# Patient Record
Sex: Male | Born: 1975 | Race: Black or African American | Hispanic: No | Marital: Single | Smoking: Current every day smoker
Health system: Southern US, Community
[De-identification: ages and names within clinical notes are randomized; demographics above are authoritative.]

---

## 2003-08-22 ENCOUNTER — Emergency Department (HOSPITAL_COMMUNITY): Admission: EM | Admit: 2003-08-22 | Discharge: 2003-08-22 | Payer: Self-pay | Admitting: Family Medicine

## 2004-07-08 ENCOUNTER — Emergency Department (HOSPITAL_COMMUNITY): Admission: EM | Admit: 2004-07-08 | Discharge: 2004-07-08 | Payer: Self-pay | Admitting: Emergency Medicine

## 2005-04-19 IMAGING — CR DG HAND COMPLETE 3+V*R*
3 series · 3 of 3 positions shown · non-contrast
Comparison: none

CLINICAL DATA: Injured hand punching a wall. 
 RIGHT HAND, THREE VIEWS 
 Three views of the right hand were obtained.  There is a nondisplaced fracture through the base of the fifth metacarpal with adjacent soft tissue swelling.  No other acute bony abnormality is seen. 
 IMPRESSION
 Nondisplaced fracture through the base of the right fifth metacarpal.

[view not recorded (1 of 3)]
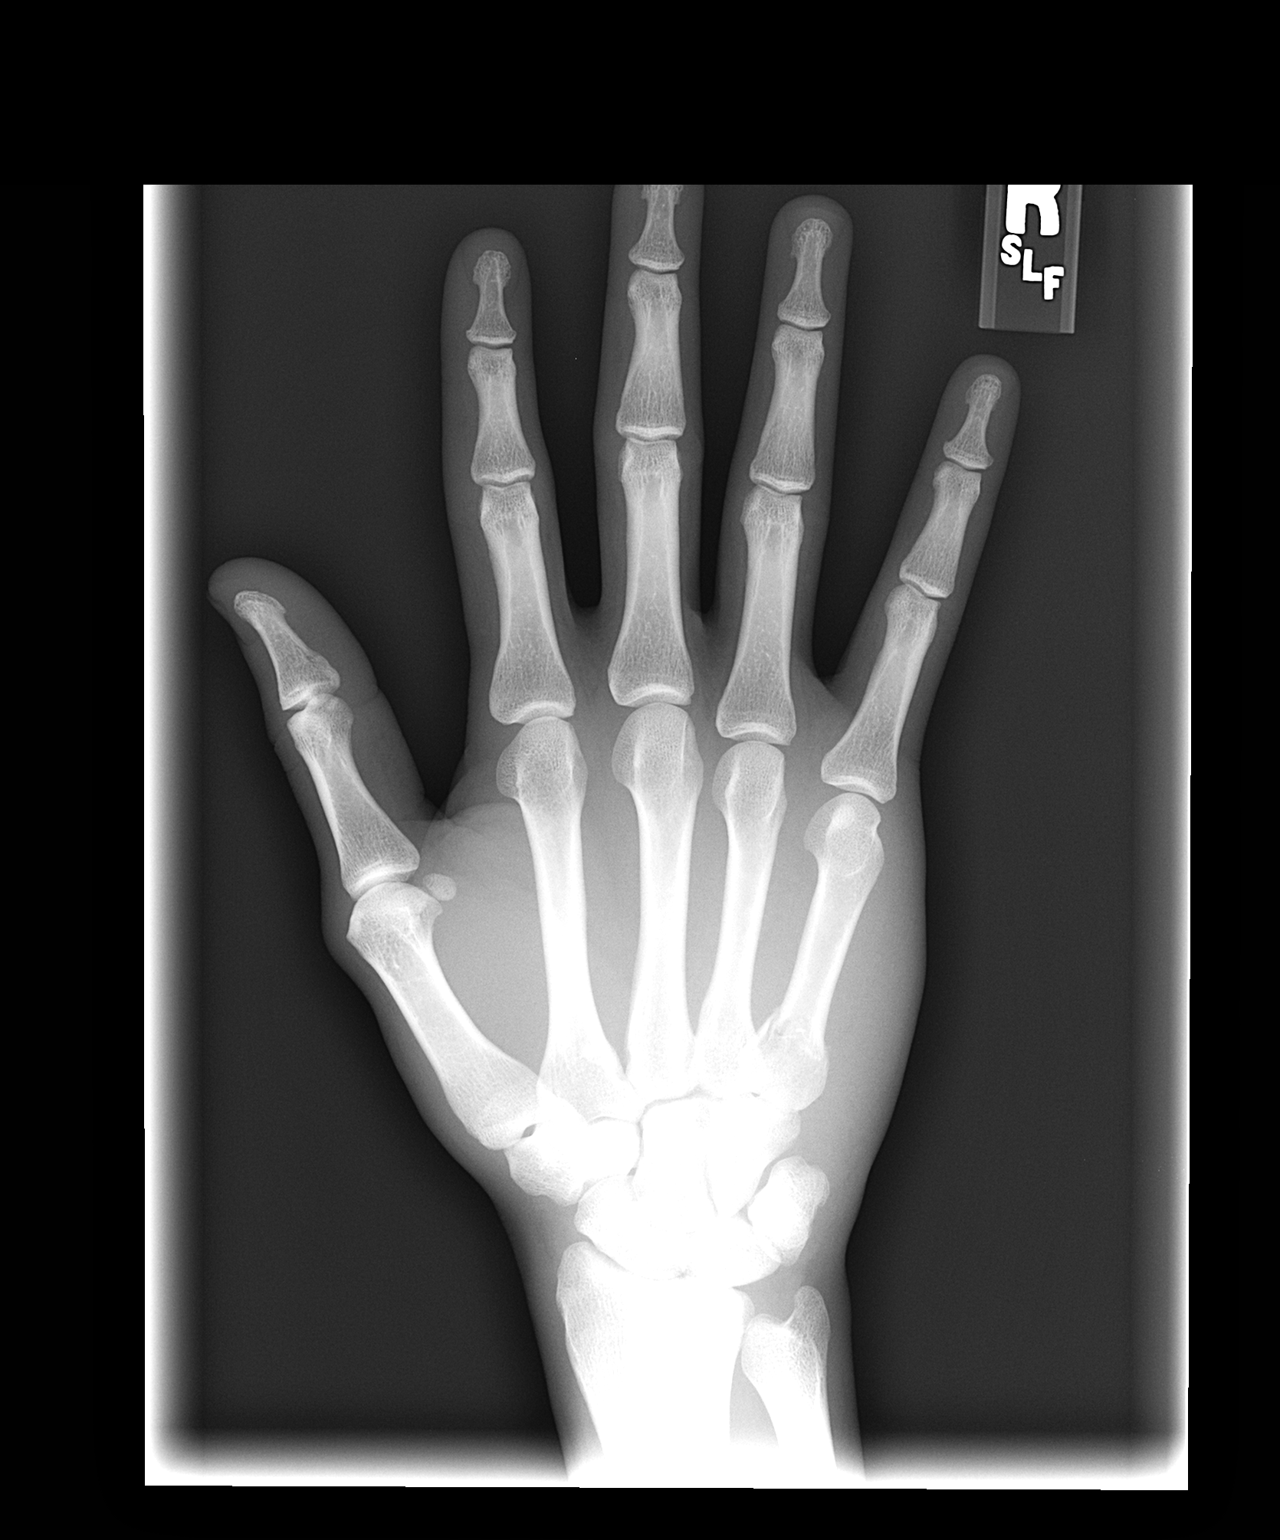

[view not recorded (2 of 3)]
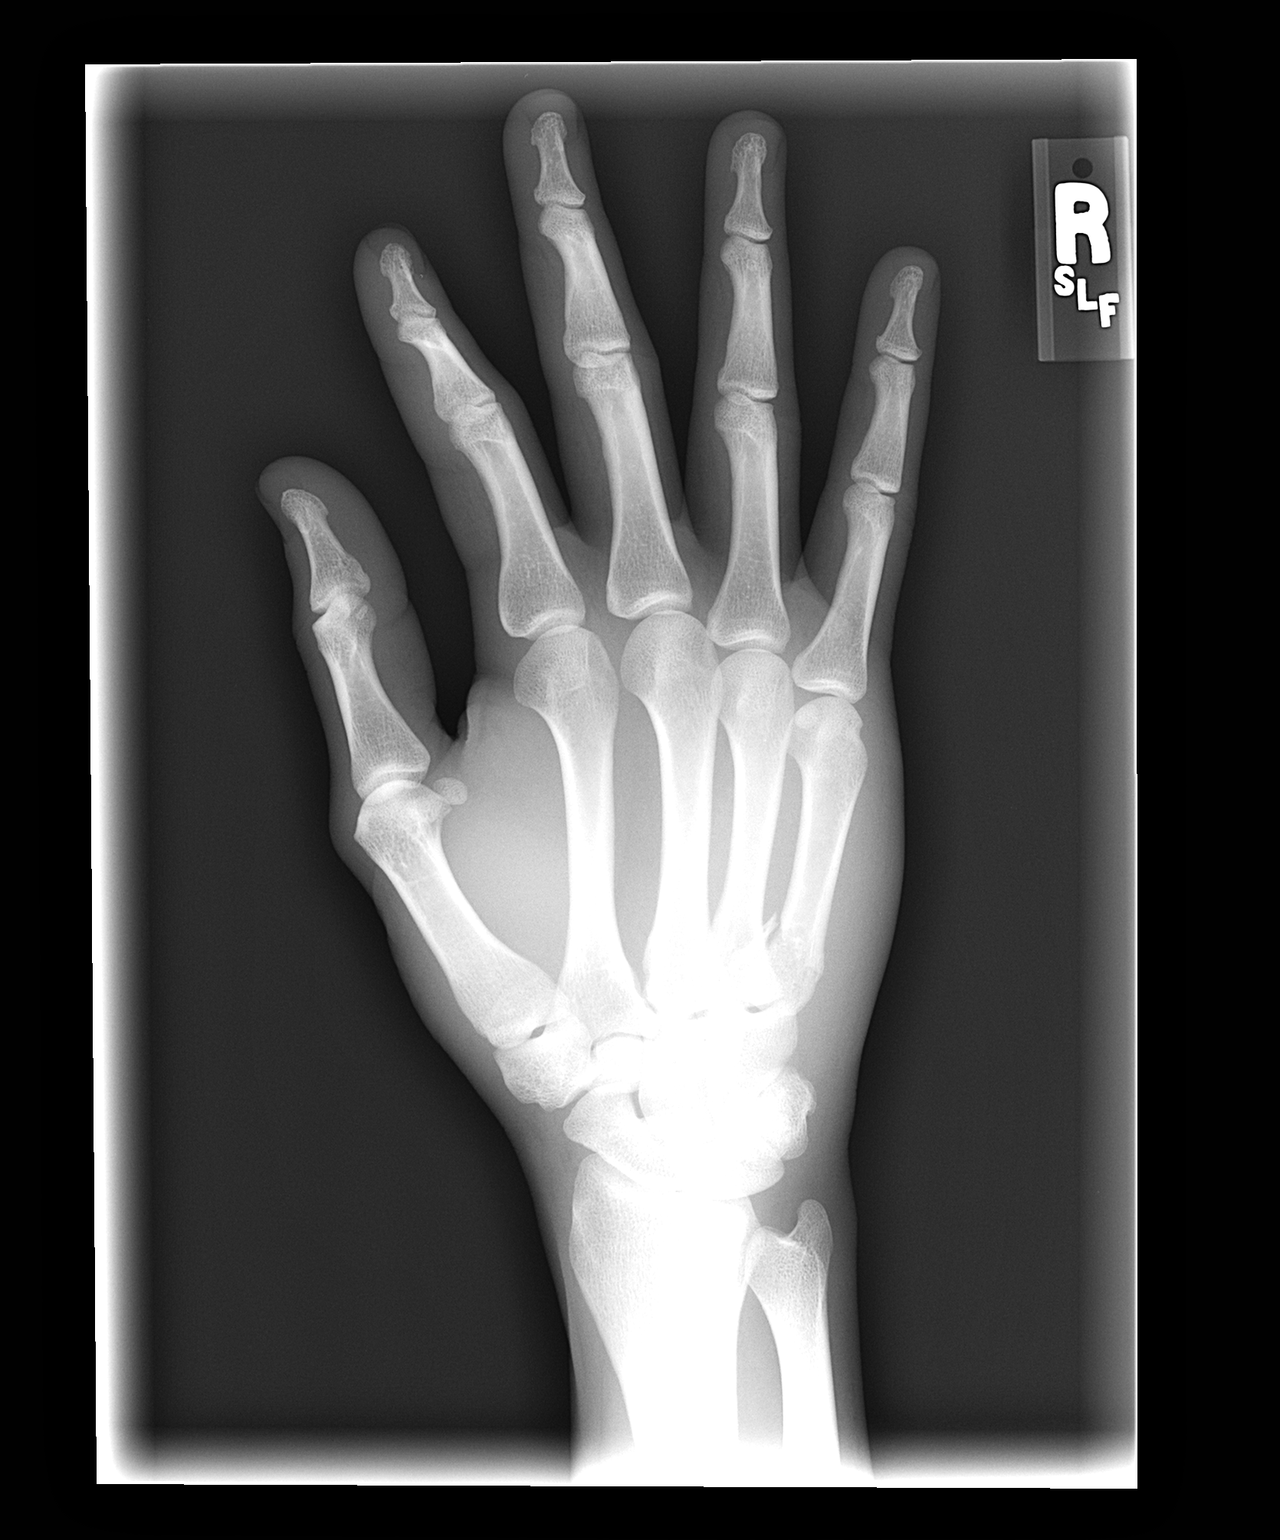

[view not recorded (3 of 3)]
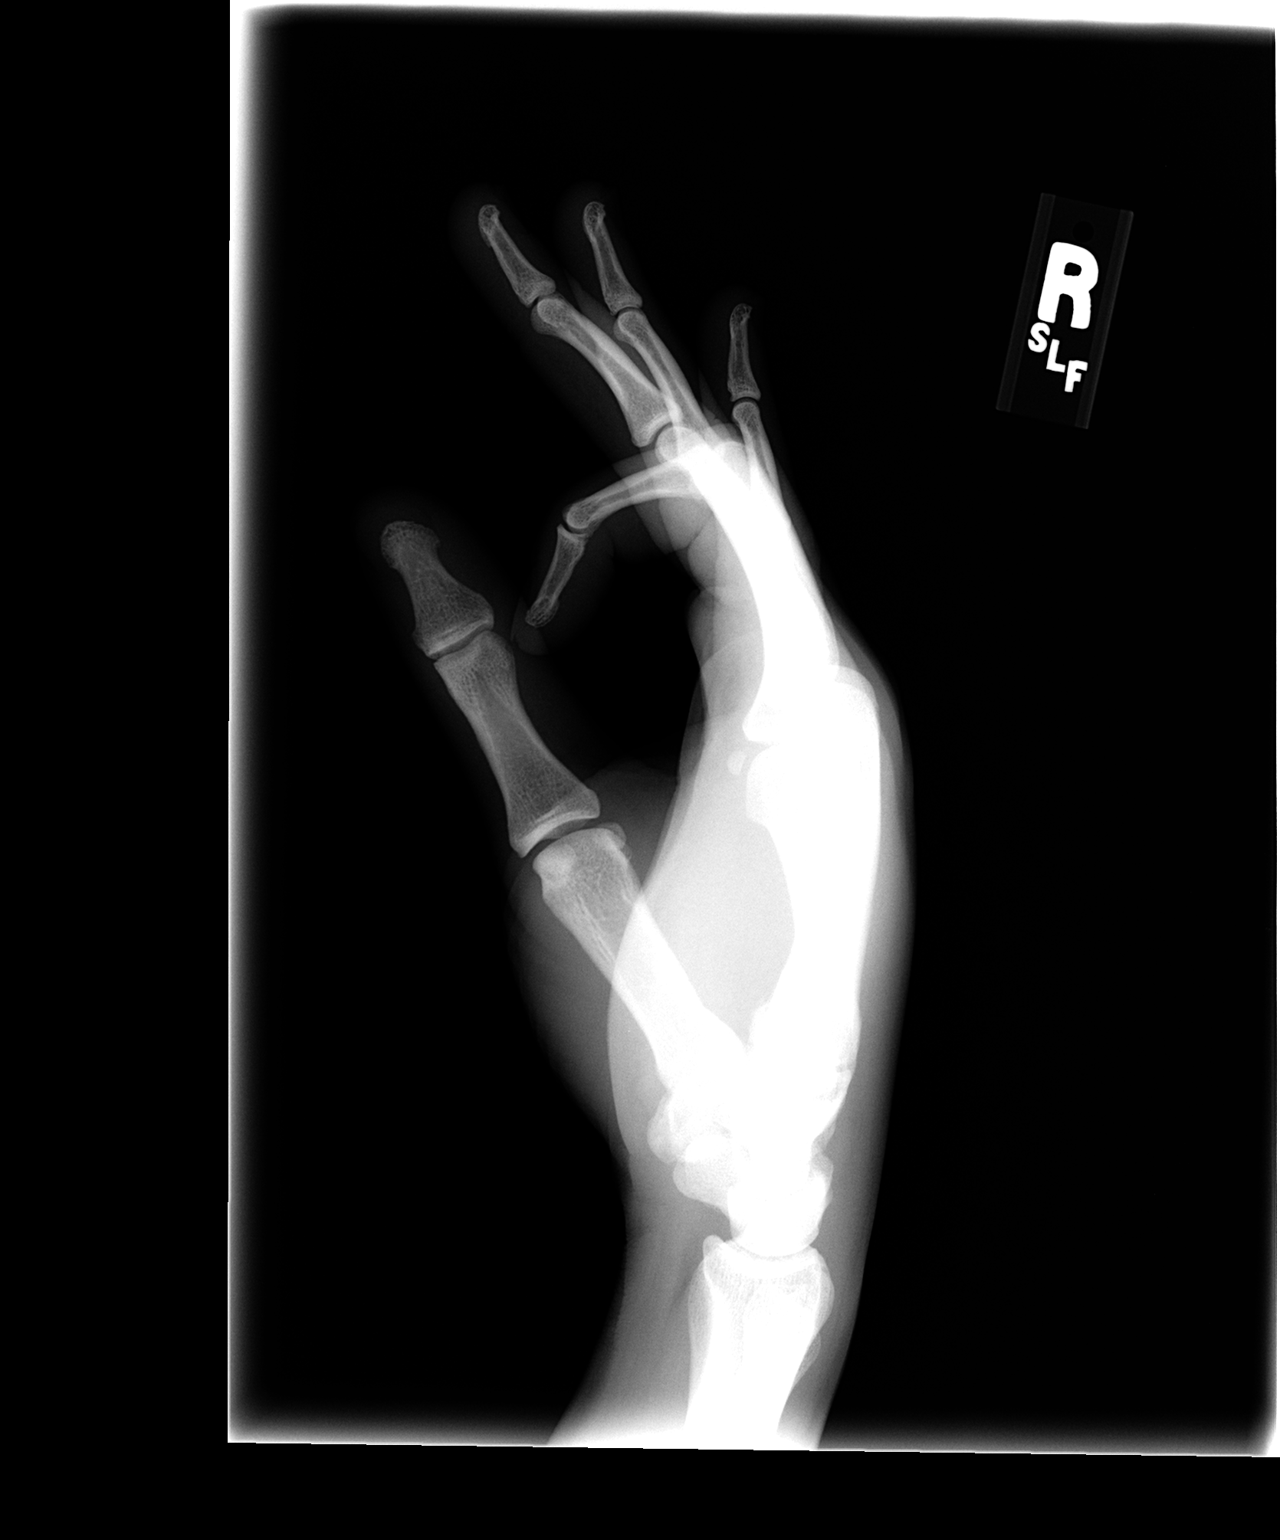

[3 of 3 positions shown; findings below may reference images not displayed]

## 2010-06-12 ENCOUNTER — Emergency Department (HOSPITAL_COMMUNITY)
Admission: EM | Admit: 2010-06-12 | Discharge: 2010-06-12 | Disposition: A | Discharge status: Home / Self Care | Payer: Self-pay | Attending: Emergency Medicine | Admitting: Emergency Medicine

## 2010-06-12 DIAGNOSIS — X58XXXA Exposure to other specified factors, initial encounter: Secondary | ICD-10-CM | POA: Insufficient documentation

## 2010-06-12 DIAGNOSIS — S61209A Unspecified open wound of unspecified finger without damage to nail, initial encounter: Secondary | ICD-10-CM | POA: Insufficient documentation

## 2011-01-13 ENCOUNTER — Emergency Department (HOSPITAL_COMMUNITY): Payer: BC Managed Care – PPO

## 2011-01-13 ENCOUNTER — Emergency Department (HOSPITAL_COMMUNITY)
Admission: EM | Admit: 2011-01-13 | Discharge: 2011-01-13 | Disposition: A | Discharge status: Home / Self Care | Payer: BC Managed Care – PPO | Attending: Emergency Medicine | Admitting: Emergency Medicine

## 2011-01-13 DIAGNOSIS — R091 Pleurisy: Secondary | ICD-10-CM | POA: Insufficient documentation

## 2011-01-13 DIAGNOSIS — R05 Cough: Secondary | ICD-10-CM | POA: Insufficient documentation

## 2011-01-13 DIAGNOSIS — R059 Cough, unspecified: Secondary | ICD-10-CM | POA: Insufficient documentation

## 2011-01-13 DIAGNOSIS — R079 Chest pain, unspecified: Secondary | ICD-10-CM | POA: Insufficient documentation

## 2011-01-13 LAB — BASIC METABOLIC PANEL
BUN: 14 mg/dL (ref 6–23)
Calcium: 9.9 mg/dL (ref 8.4–10.5)
Chloride: 104 mEq/L (ref 96–112)
Creatinine, Ser: 1.05 mg/dL (ref 0.50–1.35)
GFR calc non Af Amer: >60 mL/min (ref >60)
Glucose, Bld: 86 mg/dL (ref 70–99)
Sodium: 140 mEq/L (ref 135–145)

## 2011-01-13 LAB — CBC
HCT: 44.1 % (ref 39.0–52.0)
Hemoglobin: 14.8 g/dL (ref 13.0–17.0)
MCV: 94.6 fL (ref 78.0–100.0)
RBC: 4.66 MIL/uL (ref 4.22–5.81)

## 2011-01-13 LAB — DIFFERENTIAL
Basophils Relative: 1 % (ref 0–1)
Eosinophils Absolute: 0.2 10*3/uL (ref 0.0–0.7)
Eosinophils Relative: 3 % (ref 0–5)
Monocytes Absolute: 0.6 10*3/uL (ref 0.1–1.0)
Monocytes Relative: 10 % (ref 3–12)

## 2011-01-13 MED ORDER — IOHEXOL 300 MG/ML  SOLN
100.0000 mL | Freq: Once | INTRAMUSCULAR | Status: AC | PRN
  Administered 2011-01-13: 100 mL via INTRAVENOUS

## 2012-06-02 ENCOUNTER — Emergency Department (HOSPITAL_COMMUNITY)
Admission: EM | Admit: 2012-06-02 | Discharge: 2012-06-02 | Disposition: A | Discharge status: Home / Self Care | Payer: Self-pay | Attending: Emergency Medicine | Admitting: Emergency Medicine

## 2012-06-02 ENCOUNTER — Encounter (HOSPITAL_COMMUNITY): Payer: Self-pay | Admitting: *Deleted

## 2012-06-02 DIAGNOSIS — R599 Enlarged lymph nodes, unspecified: Secondary | ICD-10-CM | POA: Insufficient documentation

## 2012-06-02 DIAGNOSIS — R05 Cough: Secondary | ICD-10-CM | POA: Insufficient documentation

## 2012-06-02 DIAGNOSIS — K029 Dental caries, unspecified: Secondary | ICD-10-CM | POA: Insufficient documentation

## 2012-06-02 DIAGNOSIS — K0401 Reversible pulpitis: Secondary | ICD-10-CM | POA: Diagnosis present

## 2012-06-02 DIAGNOSIS — J069 Acute upper respiratory infection, unspecified: Secondary | ICD-10-CM | POA: Insufficient documentation

## 2012-06-02 DIAGNOSIS — R059 Cough, unspecified: Secondary | ICD-10-CM | POA: Insufficient documentation

## 2012-06-02 DIAGNOSIS — F17200 Nicotine dependence, unspecified, uncomplicated: Secondary | ICD-10-CM | POA: Insufficient documentation

## 2012-06-02 DIAGNOSIS — K089 Disorder of teeth and supporting structures, unspecified: Secondary | ICD-10-CM | POA: Insufficient documentation

## 2012-06-02 MED ORDER — IBUPROFEN 600 MG PO TABS: 600.0000 mg | ORAL_TABLET | Freq: Four times a day (QID) | ORAL | Status: DC | PRN

## 2012-06-02 MED ORDER — PENICILLIN V POTASSIUM 500 MG PO TABS: 500.0000 mg | ORAL_TABLET | Freq: Four times a day (QID) | ORAL | Status: AC

## 2012-06-02 NOTE — ED Notes (Addendum)
Pt reports drinking 12 drinks ETOH daily. Pt reports he was hungover a few days ago and vomited forcefully and hurt his throat. Since then he has has right sided pain in his gum and cheek area. Denies any changes with vision or blurry vision. Reports feels like a brain freeze when he drinks something cold. Pain 8/10  Pt reports no hx of tooth pain in past

## 2012-06-02 NOTE — ED Notes (Signed)
Pt reports taking 2 extra strength tylenol prior to arrival.

## 2012-06-02 NOTE — ED Provider Notes (Signed)
History     CSN: [id number]  Arrival date & time 06/02/12  1136   First MD Initiated Contact with Patient 06/02/12 1247      Chief Complaint  Patient presents with  . Facial Pain    (Consider location/radiation/quality/duration/timing/severity/associated sxs/prior treatment) Patient is a 37 y.o. male presenting with pharyngitis. The history is provided by the patient.  Sore Throat This is a new problem. Episode onset: 3 days ago. The problem occurs constantly. The problem has been unchanged. Associated symptoms include coughing (mild), a sore throat and vomiting (several times 3 days ago, none since). Pertinent negatives include no abdominal pain, chest pain, fever, headaches, nausea, neck pain or numbness. Exacerbated by: vomiting. Treatments tried: motrin. The treatment provided mild relief.    History reviewed. No pertinent past medical history.  History reviewed. No pertinent past surgical history.  History reviewed. No pertinent family history.  History  Substance Use Topics  . Smoking status: Current Every Day Smoker -- 1.50 packs/day for 10 years    Types: Cigarettes  . Smokeless tobacco: Not on file  . Alcohol Use: Yes     Comment: 12 drinks/ daily      Review of Systems  Constitutional: Negative for fever.  HENT: Positive for sore throat. Negative for rhinorrhea, drooling and neck pain.   Eyes: Negative for pain.  Respiratory: Positive for cough (mild). Negative for shortness of breath.   Cardiovascular: Negative for chest pain and leg swelling.  Gastrointestinal: Positive for vomiting (several times 3 days ago, none since). Negative for nausea, abdominal pain and diarrhea.  Genitourinary: Negative for dysuria and hematuria.  Musculoskeletal: Negative for gait problem.  Skin: Negative for color change.  Neurological: Negative for numbness and headaches.  Hematological: Negative for adenopathy.  Psychiatric/Behavioral: Negative for behavioral problems.  All  other systems reviewed and are negative.    Allergies  Review of patient's allergies indicates no known allergies.  Home Medications   Current Outpatient Rx  Name  Route  Sig  Dispense  Refill  . ibuprofen (ADVIL,MOTRIN) 800 MG tablet   Oral   Take 800 mg by mouth every 8 (eight) hours as needed for pain.         Marland Kitchen ibuprofen (ADVIL,MOTRIN) 600 MG tablet   Oral   Take 1 tablet (600 mg total) by mouth every 6 (six) hours as needed for pain.   30 tablet   0   . penicillin v potassium (VEETID) 500 MG tablet   Oral   Take 1 tablet (500 mg total) by mouth 4 (four) times daily.   40 tablet   0     BP 155/96  Pulse 62  Temp(Src) 98.7 F (37.1 C) (Oral)  Resp 18  SpO2 98%  Physical Exam  Nursing note and vitals reviewed. Constitutional: He is oriented to person, place, and time. He appears well-developed and well-nourished.  HENT:  Head: Normocephalic and atraumatic.  Right Ear: External ear normal.  Left Ear: External ear normal.  Nose: Nose normal.  Mouth/Throat: Oropharynx is clear and moist. No oropharyngeal exudate.  Normal appearing oropharynx w/out exudate or asymmetry. Mild bilateral anterior cervical adenopathy.   Significant erosion to bilateral upper 3rd molars. No evidence or peri-apical abscess.   Eyes: Conjunctivae and EOM are normal. Pupils are equal, round, and reactive to light.  Neck: Normal range of motion. Neck supple.  Cardiovascular: Normal rate, regular rhythm, normal heart sounds and intact distal pulses.  Exam reveals no gallop and no friction rub.  No murmur heard. Pulmonary/Chest: Effort normal and breath sounds normal. No respiratory distress. He has no wheezes.  Abdominal: Soft. Bowel sounds are normal. He exhibits no distension. There is no tenderness. There is no rebound and no guarding.  Musculoskeletal: Normal range of motion. He exhibits no edema and no tenderness.  Neurological: He is alert and oriented to person, place, and time.   Skin: Skin is warm and dry.  Psychiatric: He has a normal mood and affect. His behavior is normal.    ED Course  Procedures (including critical care time)  Labs Reviewed - No data to display No results found.   1. Viral URI   2. Pulpitis       MDM  10:11 PM 37 y.o. male pw throat pain that began 3 days ago after several episodes of vomiting. Pt states he was drinking the night before, had nbnb emesis the next day and has had a sore throat since. He also notes ttp proximal to his right upper 3rd molar. Pt denies N/V/D/F, denies difficulty swallowing or breathing. Pt AFVSS here, appears well on exam w/out any evidence of acute dental infection. Suspect pt has pulpitis from dental caries and poor dentition, irritation of his oral mucosa from persistent smoking, and likely viral URI.   10:11 PM: Pt continues to appear well. I have discussed the diagnosis/risks/treatment options with the patient and believe the pt to be eligible for discharge home to follow-up with a dentist next week. We also discussed returning to the ED immediately if new or worsening sx occur. We discussed the sx which are most concerning (e.g., worsening pain, difficulty swallowing or breathing) that necessitate immediate return. Any new prescriptions provided to the patient are listed below.  Discharge Medication List as of 06/02/2012 12:57 PM    START taking these medications   Details  !! ibuprofen (ADVIL,MOTRIN) 600 MG tablet Take 1 tablet (600 mg total) by mouth every 6 (six) hours as needed for pain., Starting 06/02/2012, Until Discontinued, Print    penicillin v potassium (VEETID) 500 MG tablet Take 1 tablet (500 mg total) by mouth 4 (four) times daily., Starting 06/02/2012, Last dose on Sat 06/09/12, Print     !! - Potential duplicate medications found. Please discuss with provider.       Clinical Impression 1. Viral URI   2. Pulpitis          Purvis Sheffield, MD 06/02/12 2211

## 2012-06-02 NOTE — ED Notes (Signed)
Harrison, MD at bedside. 

## 2012-06-04 NOTE — ED Provider Notes (Signed)
I saw and evaluated the patient, reviewed the resident's note and I agree with the findings and plan. Pt with uri symptoms. No facial swelling or redness, no trouble breathing or swallowing.   Suzi Roots, MD 06/04/12 [phone]

## 2020-02-13 ENCOUNTER — Other Ambulatory Visit: Payer: Self-pay

## 2020-02-13 DIAGNOSIS — Z20822 Contact with and (suspected) exposure to covid-19: Secondary | ICD-10-CM

## 2020-02-14 LAB — NOVEL CORONAVIRUS, NAA: SARS-CoV-2, NAA: DETECTED — AB

## 2020-02-14 LAB — SARS-COV-2, NAA 2 DAY TAT

## 2020-02-24 ENCOUNTER — Ambulatory Visit: Payer: Self-pay

## 2020-02-25 ENCOUNTER — Other Ambulatory Visit: Payer: Self-pay

## 2020-02-25 ENCOUNTER — Other Ambulatory Visit: Payer: Managed Care, Other (non HMO)

## 2020-02-25 DIAGNOSIS — Z20822 Contact with and (suspected) exposure to covid-19: Secondary | ICD-10-CM

## 2020-02-26 LAB — NOVEL CORONAVIRUS, NAA: SARS-CoV-2, NAA: NOT DETECTED

## 2020-02-26 LAB — SARS-COV-2, NAA 2 DAY TAT

## 2020-08-18 ENCOUNTER — Emergency Department (HOSPITAL_COMMUNITY): Payer: Self-pay

## 2020-08-18 ENCOUNTER — Emergency Department (HOSPITAL_COMMUNITY)
Admission: EM | Admit: 2020-08-18 | Discharge: 2020-08-18 | Disposition: A | Discharge status: Home / Self Care | Payer: Self-pay | Attending: Emergency Medicine | Admitting: Emergency Medicine

## 2020-08-18 ENCOUNTER — Encounter (HOSPITAL_COMMUNITY): Payer: Self-pay | Admitting: *Deleted

## 2020-08-18 DIAGNOSIS — R739 Hyperglycemia, unspecified: Secondary | ICD-10-CM | POA: Insufficient documentation

## 2020-08-18 DIAGNOSIS — R072 Precordial pain: Secondary | ICD-10-CM | POA: Insufficient documentation

## 2020-08-18 DIAGNOSIS — R0789 Other chest pain: Secondary | ICD-10-CM

## 2020-08-18 DIAGNOSIS — R079 Chest pain, unspecified: Secondary | ICD-10-CM

## 2020-08-18 DIAGNOSIS — F17210 Nicotine dependence, cigarettes, uncomplicated: Secondary | ICD-10-CM | POA: Insufficient documentation

## 2020-08-18 LAB — COMPREHENSIVE METABOLIC PANEL
ALT: 40 U/L (ref 0–44)
AST: 32 U/L (ref 15–41)
Albumin: 4 g/dL (ref 3.5–5.0)
Alkaline Phosphatase: 48 U/L (ref 38–126)
Anion gap: 8 (ref 5–15)
BUN: 7 mg/dL (ref 6–20)
CO2: 29 mmol/L (ref 22–32)
Calcium: 9.5 mg/dL (ref 8.9–10.3)
Chloride: 102 mmol/L (ref 98–111)
Creatinine, Ser: 1.17 mg/dL (ref 0.61–1.24)
GFR, Estimated: >60 mL/min (ref >60)
Glucose, Bld: 111 mg/dL — ABNORMAL HIGH (ref 70–99)
Potassium: 4.1 mmol/L (ref 3.5–5.1)
Sodium: 139 mmol/L (ref 135–145)
Total Bilirubin: 1.1 mg/dL (ref 0.3–1.2)
Total Protein: 7.6 g/dL (ref 6.5–8.1)

## 2020-08-18 LAB — CBC
HCT: 44.2 % (ref 39.0–52.0)
Hemoglobin: 14.4 g/dL (ref 13.0–17.0)
MCH: 31.1 pg (ref 26.0–34.0)
MCHC: 32.6 g/dL (ref 30.0–36.0)
MCV: 95.5 fL (ref 80.0–100.0)
Platelets: 176 10*3/uL (ref 150–400)
RBC: 4.63 MIL/uL (ref 4.22–5.81)
RDW: 12.1 % (ref 11.5–15.5)
WBC: 6.3 10*3/uL (ref 4.0–10.5)
nRBC: 0 % (ref 0.0–0.2)

## 2020-08-18 LAB — LIPASE, BLOOD: Lipase: 27 U/L (ref 11–51)

## 2020-08-18 LAB — TROPONIN I (HIGH SENSITIVITY): Troponin I (High Sensitivity): 3 ng/L (ref <18)

## 2020-08-18 MED ORDER — ALUM & MAG HYDROXIDE-SIMETH 200-200-20 MG/5ML PO SUSP
30.0000 mL | Freq: Once | ORAL | Status: AC
  Administered 2020-08-18: 30 mL via ORAL
  Filled 2020-08-18: qty 30

## 2020-08-18 MED ORDER — OMEPRAZOLE 20 MG PO CPDR: 20.0000 mg | DELAYED_RELEASE_CAPSULE | Freq: Every day | ORAL | 0 refills | Status: DC

## 2020-08-18 MED ORDER — ONDANSETRON 4 MG PO TBDP
4.0000 mg | ORAL_TABLET | Freq: Once | ORAL | Status: AC
  Administered 2020-08-18: 4 mg via ORAL
  Filled 2020-08-18: qty 1

## 2020-08-18 MED ORDER — LIDOCAINE VISCOUS HCL 2 % MT SOLN
15.0000 mL | Freq: Once | OROMUCOSAL | Status: AC
  Administered 2020-08-18: 15 mL via ORAL
  Filled 2020-08-18: qty 15

## 2020-08-18 MED ORDER — SUCRALFATE 1 G PO TABS
1.0000 g | ORAL_TABLET | Freq: Once | ORAL | Status: AC
  Administered 2020-08-18: 1 g via ORAL
  Filled 2020-08-18: qty 1

## 2020-08-18 NOTE — ED Provider Notes (Signed)
[facility]-EMERGENCY DEPT Provider Note   CSN: [id number] Arrival date & time: 08/18/20  1416     History Chief Complaint  Patient presents with  . Chest Pain    Trevor Shannon is a 45 y.o. male.  HPI   Patient with no significant medical history presents to the emergency department with chief complaint of substernal chest pain.  Patient states pain started on Saturday morning, describes the pain as a pressure-like sensation which makes him feel like he needs to vomit and burp, he states the night prior he was out drinking a lot of alcohol and he woke up Saturday morning with this pain.  The pain does not radiate, is not associated with shortness of breath, becoming diaphoretic, lightheadedness, dizziness, paresthesia or weakness in the upper or lower extremities.  Patient has no cardiac history, no history of PEs or DVTs, currently not on hormone therapy.  Patient does endorse that he smokes 1 pack of cigarettes a week, he is a nondiabetic, does not have hypertension, no history of Marfan's disease or connective tissue disorders.  Patient states eating and drinking makes the pain worse, denies alleviating factors.  Patient denies headaches, fevers, chills, shortness of breath, abdominal pain, urinary symptoms, worsening pedal edema.  History reviewed. No pertinent past medical history.  Patient Active Problem List   Diagnosis Date Noted  . Pulpitis 06/02/2012  . Viral URI 06/02/2012    History reviewed. No pertinent surgical history.     No family history on file.  Social History   Tobacco Use  . Smoking status: Current Every Day Smoker    Packs/day: 1.50    Years: 10.00    Pack years: 15.00    Types: Cigarettes  Substance Use Topics  . Alcohol use: Yes    Comment: 12 drinks/ daily  . Drug use: Yes    Types: Marijuana    Home Medications Prior to Admission medications   Medication Sig Start Date End Date Taking? Authorizing Provider   omeprazole (PRILOSEC) 20 MG capsule Take 1 capsule (20 mg total) by mouth daily. 08/18/20 09/17/20 Yes Carroll Sage, PA-C  ibuprofen (ADVIL,MOTRIN) 600 MG tablet Take 1 tablet (600 mg total) by mouth every 6 (six) hours as needed for pain. 06/02/12   Purvis Sheffield, MD  ibuprofen (ADVIL,MOTRIN) 800 MG tablet Take 800 mg by mouth every 8 (eight) hours as needed for pain.    [provider]    Allergies    Patient has no known allergies.  Review of Systems   Review of Systems  Constitutional: Negative for chills and fever.  HENT: Negative for congestion and sore throat.   Respiratory: Negative for shortness of breath.   Cardiovascular: Positive for chest pain.  Gastrointestinal: Positive for nausea. Negative for abdominal pain, blood in stool and vomiting.  Genitourinary: Negative for dysuria and enuresis.  Musculoskeletal: Negative for back pain.  Skin: Negative for rash.  Neurological: Negative for headaches.  Hematological: Does not bruise/bleed easily.    Physical Exam Updated Vital Signs BP (!) 174/102   Pulse 69   Temp 98.8 F (37.1 C) (Oral)   Resp 16   Ht 6\' 1"  (1.854 m)   Wt 70.8 kg   SpO2 100%   BMI 20.58 kg/m   Physical Exam Vitals and nursing note reviewed.  Constitutional:      General: He is not in acute distress.    Appearance: He is not ill-appearing.  HENT:     Head: Normocephalic and  atraumatic.     Nose: No congestion.  Eyes:     Conjunctiva/sclera: Conjunctivae normal.  Cardiovascular:     Rate and Rhythm: Normal rate and regular rhythm.     Pulses: Normal pulses.     Heart sounds: No murmur heard. No friction rub. No gallop.      Comments: Patient is currently chest pain-free, chest is palpated nontender to palpation. Pulmonary:     Effort: No respiratory distress.     Breath sounds: No wheezing, rhonchi or rales.  Abdominal:     Palpations: Abdomen is soft.     Tenderness: There is no abdominal tenderness.  Musculoskeletal:      Right lower leg: No edema.     Left lower leg: No edema.  Skin:    General: Skin is warm and dry.  Neurological:     Mental Status: He is alert.  Psychiatric:        Mood and Affect: Mood normal.     ED Results / Procedures / Treatments   Labs (all labs ordered are listed, but only abnormal results are displayed) Labs Reviewed  COMPREHENSIVE METABOLIC PANEL - Abnormal; Notable for the following components:      Result Value   Glucose, Bld 111 (*)    All other components within normal limits  CBC  LIPASE, BLOOD  TROPONIN I (HIGH SENSITIVITY)    EKG None  Radiology DG Chest 1 View  Result Date: 08/18/2020 CLINICAL DATA:  Chest pain EXAM: CHEST  1 VIEW COMPARISON:  Chest radiograph and chest CT January 13, 2011 FINDINGS: Lungs are clear. Heart size and pulmonary vascularity are normal. No adenopathy. No pneumothorax. No bone lesions. IMPRESSION: Lungs clear.  Cardiac silhouette normal. Electronically Signed   By: Bretta Bang III M.D.   On: 08/18/2020 15:11    Procedures Procedures   Medications Ordered in ED Medications  alum & mag hydroxide-simeth (MAALOX/MYLANTA) 200-200-20 MG/5ML suspension 30 mL (30 mLs Oral Given 08/18/20 1743)    And  lidocaine (XYLOCAINE) 2 % viscous mouth solution 15 mL (15 mLs Oral Given 08/18/20 1742)  sucralfate (CARAFATE) tablet 1 g (1 g Oral Given 08/18/20 1740)  ondansetron (ZOFRAN-ODT) disintegrating tablet 4 mg (4 mg Oral Given 08/18/20 1741)    ED Course  I have reviewed the triage vital signs and the nursing notes.  Pertinent labs & imaging results that were available during my care of the patient were reviewed by me and considered in my medical decision making (see chart for details).    MDM Rules/Calculators/A&P                         Initial impression-patient presents with substernal chest pain since Saturday, he is alert, does not appear in acute distress, vital signs reassuring.  Patient has no chest pain this time.   Suspect this is gastritis/GERD, will obtain basic chest pain work-up, start patient on a GI cocktail and Carafate and reassess.  Work-up-CBC unremarkable, CMP shows hyperglycemia of 111, lipase 27, troponin 3.  Chest x-ray negative for acute findings.  EKG sinus without signs of ischemia no ST elevation depression noted.  Reassessment patient was reassessed after providing him with GI cocktail as well as Carafate.  He states he feels much better has no complaints this time.  Vital signs  remained stable.  Patient agreeable for discharge at this time.  Rule out- I have low suspicion for ACS as history is atypical, patient has no cardiac  history, EKG was sinus rhythm without signs of ischemia, first troponin is 3, will defer second troponin as patient has been having chest pain for greater than 12 hours, would expect elevation if ACS was present.  Low suspicion for PE as patient denies pleuritic chest pain, shortness of breath, patient denies leg pain, no pedal edema noted on exam, patient was PERC negative.  Low suspicion for AAA or aortic dissection as history is atypical, patient has low risk factors.  Low suspicion for systemic infection as patient is nontoxic-appearing, vital signs reassuring, no obvious source infection noted on exam.   Plan- 1. Substernal chest pain since resolved suspect is secondary due to gastritis/GERD, will start him on a PPI follow-up with primary care doctor for further evaluation.  Vital signs have remained stable, no indication for hospital admission.  Patient given at home care as well strict return precautions.  Patient verbalized that they understood agreed to said plan.   Final Clinical Impression(s) / ED Diagnoses Final diagnoses:  Atypical chest pain    Rx / DC Orders ED Discharge Orders         Ordered    omeprazole (PRILOSEC) 20 MG capsule  Daily        08/18/20 1851           Carroll Sage, PA-C 08/18/20 1853    Charlynne Pander,  MD 08/18/20 [phone]

## 2020-08-18 NOTE — Discharge Instructions (Addendum)
I suspect you are suffering from gastritis and GERD.  started you on a acid pill please take as prescribed.  May also take Pepcid in conjunction with this  I want you to follow-up with your PCP in 2 weeks time for reassessment.  Come back to the emergency department if you develop chest pain, shortness of breath, severe abdominal pain, uncontrolled nausea, vomiting, diarrhea.

## 2020-08-18 NOTE — ED Triage Notes (Signed)
Emergency Medicine Provider Triage Evaluation Note  Rodolfo Gaster , a 45 y.o. male  was evaluated in triage.  Pt complains of ongoing chest pain since Saturday.  Patient reports that he was drinking with his friends on Saturday, and started to have some nausea and vomiting.  Vomiting has improved, however he continues to have some chest pain which comes and goes.  No aggravating or relieving factors.  He does currently smoke 1 and half packs a day.  No prior history of this in the past.  Denies any shortness of breath.  No cough, fevers, chills or no recent other alcohol or drug use  Review of Systems  Positive: As above Negative: As above  Physical Exam  BP (!) 160/112 (BP Location: Left Arm)   Pulse 77   Temp 98.8 F (37.1 C) (Oral)   Resp 18   Ht 6\' 1"  (1.854 m)   Wt 70.8 kg   SpO2 98%   BMI 20.58 kg/m  Gen:   Awake, no distress  HEENT:  Atraumatic  Resp:  Normal effort  Cardiac:  Normal rate  Abd:   Nondistended, nontender  MSK:   Moves extremities without difficulty Neuro:  Speech clear   Medical Decision Making  Medically screening exam initiated at 2:37 PM.  Appropriate orders placed.  Mical Kicklighter was informed that the remainder of the evaluation will be completed by another provider, this initial triage assessment does not replace that evaluation, and the importance of remaining in the ED until their evaluation is complete.  Clinical Impression  Stable for evaluation by provider   Elfredia Nevins, PA-C 08/18/20 1440

## 2020-08-18 NOTE — ED Triage Notes (Signed)
Pt reports chest pain x 3 days. He reports getting drunk the night prior and vomiting, was unable to eat. He still cannot eat and feels like he has a gas bubble in his chest.

## 2021-01-18 ENCOUNTER — Ambulatory Visit (HOSPITAL_COMMUNITY)
Admission: EM | Admit: 2021-01-18 | Discharge: 2021-01-18 | Disposition: A | Discharge status: Home / Self Care | Payer: 59 | Attending: Physician Assistant | Admitting: Physician Assistant

## 2021-01-18 ENCOUNTER — Encounter (HOSPITAL_COMMUNITY): Payer: Self-pay

## 2021-01-18 ENCOUNTER — Other Ambulatory Visit: Payer: Self-pay

## 2021-01-18 DIAGNOSIS — R369 Urethral discharge, unspecified: Secondary | ICD-10-CM

## 2021-01-18 DIAGNOSIS — R300 Dysuria: Secondary | ICD-10-CM | POA: Diagnosis not present

## 2021-01-18 DIAGNOSIS — Z113 Encounter for screening for infections with a predominantly sexual mode of transmission: Secondary | ICD-10-CM | POA: Insufficient documentation

## 2021-01-18 NOTE — ED Triage Notes (Signed)
Pt presents with penile discharge and pain since yesterday.

## 2021-01-18 NOTE — Discharge Instructions (Addendum)
Will notify of results once available. Follow up with any further concerns.  

## 2021-01-18 NOTE — ED Provider Notes (Signed)
[facility]    CSN: [id number] Arrival date & time: 01/18/21  1356      History   Chief Complaint Chief Complaint  Patient presents with   STD Testing    HPI Trevor Shannon is a 45 y.o. male.   Patient here today for evaluation of penile discharge and STD screening.  Some dysuria as well.  He denies any abdominal pain, nausea, vomiting.  He has not been informed of any known exposure.  The history is provided by the patient.   History reviewed. No pertinent past medical history.  Patient Active Problem List   Diagnosis Date Noted   Pulpitis 06/02/2012   Viral URI 06/02/2012    History reviewed. No pertinent surgical history.     Home Medications    Prior to Admission medications   Medication Sig Start Date End Date Taking? Authorizing Provider  ibuprofen (ADVIL,MOTRIN) 600 MG tablet Take 1 tablet (600 mg total) by mouth every 6 (six) hours as needed for pain. 06/02/12   Purvis Sheffield, MD  ibuprofen (ADVIL,MOTRIN) 800 MG tablet Take 800 mg by mouth every 8 (eight) hours as needed for pain.    [provider]  omeprazole (PRILOSEC) 20 MG capsule Take 1 capsule (20 mg total) by mouth daily. 08/18/20 09/17/20  Carroll Sage, PA-C    Family History Family History  Family history unknown: Yes    Social History Social History   Tobacco Use   Smoking status: Every Day    Packs/day: 1.50    Years: 10.00    Pack years: 15.00    Types: Cigarettes  Substance Use Topics   Alcohol use: Yes    Comment: 12 drinks/ daily   Drug use: Yes    Types: Marijuana     Allergies   Patient has no known allergies.   Review of Systems Review of Systems  Constitutional:  Negative for chills and fever.  Eyes:  Negative for discharge and redness.  Respiratory:  Negative for shortness of breath.   Gastrointestinal:  Negative for abdominal pain, nausea and vomiting.  Genitourinary:  Positive for dysuria and penile discharge.    Physical  Exam Triage Vital Signs ED Triage Vitals  Enc Vitals Group     BP 01/18/21 1537 (!) 152/97     Pulse Rate 01/18/21 1537 73     Resp 01/18/21 1537 18     Temp 01/18/21 1537 98.1 F (36.7 C)     Temp Source 01/18/21 1537 Oral     SpO2 01/18/21 1537 96 %     Weight --      Height --      Head Circumference --      Peak Flow --      Pain Score 01/18/21 1540 4     Pain Loc --      Pain Edu? --      Excl. in GC? --    No data found.  Updated Vital Signs BP (!) 152/97 (BP Location: Left Arm)   Pulse 73   Temp 98.1 F (36.7 C) (Oral)   Resp 18   SpO2 96%   Physical Exam Vitals and nursing note reviewed.  Constitutional:      General: He is not in acute distress.    Appearance: Normal appearance. He is not ill-appearing.  HENT:     Head: Normocephalic and atraumatic.     Nose: Nose normal.     Mouth/Throat:     Mouth: Mucous membranes are  moist.     Pharynx: Oropharynx is clear.  Eyes:     Conjunctiva/sclera: Conjunctivae normal.  Cardiovascular:     Rate and Rhythm: Normal rate.  Pulmonary:     Effort: Pulmonary effort is normal. No respiratory distress.  Skin:    General: Skin is warm.  Neurological:     Mental Status: He is alert.  Psychiatric:        Mood and Affect: Mood normal.        Thought Content: Thought content normal.     UC Treatments / Results  Labs (all labs ordered are listed, but only abnormal results are displayed) Labs Reviewed  CYTOLOGY, (ORAL, ANAL, URETHRAL) ANCILLARY ONLY    EKG   Radiology No results found.  Procedures Procedures (including critical care time)  Medications Ordered in UC Medications - No data to display  Initial Impression / Assessment and Plan / UC Course  I have reviewed the triage vital signs and the nursing notes.  Pertinent labs & imaging results that were available during my care of the patient were reviewed by me and considered in my medical decision making (see chart for details).  STD screening  ordered.  Will await results for further recommendation.  Final Clinical Impressions(s) / UC Diagnoses   Final diagnoses:  Screen for STD (sexually transmitted disease)     Discharge Instructions      Will notify of results once available. Follow up with any further concerns.      ED Prescriptions   None    PDMP not reviewed this encounter.   Tomi Bamberger, PA-C 01/18/21 [phone]

## 2021-01-19 LAB — CYTOLOGY, (ORAL, ANAL, URETHRAL) ANCILLARY ONLY
Chlamydia: NEGATIVE
Comment: NEGATIVE
Comment: NEGATIVE
Comment: NORMAL
Neisseria Gonorrhea: POSITIVE — AB
Trichomonas: NEGATIVE

## 2021-01-20 ENCOUNTER — Other Ambulatory Visit: Payer: Self-pay

## 2021-01-20 ENCOUNTER — Telehealth (HOSPITAL_COMMUNITY): Payer: Self-pay | Admitting: Emergency Medicine

## 2021-01-20 ENCOUNTER — Ambulatory Visit (HOSPITAL_COMMUNITY)
Admission: EM | Admit: 2021-01-20 | Discharge: 2021-01-20 | Disposition: A | Discharge status: Home / Self Care | Payer: PRIVATE HEALTH INSURANCE | Attending: Family Medicine | Admitting: Family Medicine

## 2021-01-20 DIAGNOSIS — A549 Gonococcal infection, unspecified: Secondary | ICD-10-CM | POA: Diagnosis not present

## 2021-01-20 MED ORDER — CEFTRIAXONE SODIUM 500 MG IJ SOLR
500.0000 mg | Freq: Once | INTRAMUSCULAR | Status: AC
  Administered 2021-01-20: 500 mg via INTRAMUSCULAR

## 2021-01-20 MED ORDER — LIDOCAINE HCL (PF) 1 % IJ SOLN
INTRAMUSCULAR | Status: AC
  Filled 2021-01-20: qty 2

## 2021-01-20 MED ORDER — CEFTRIAXONE SODIUM 500 MG IJ SOLR
INTRAMUSCULAR | Status: AC
  Filled 2021-01-20: qty 500

## 2021-01-20 NOTE — Telephone Encounter (Signed)
Per protocol, patient will need treatment with IM Rocephin  500 mg for positive Gonorrhea Contacted patient by phone.  Verified identity using two identifiers.  Provided positive result.  Reviewed safe sex practices, notifying partners, and refraining from sexual activities for 7 days from time of treatment.  Patient verified understanding, all questions answered.   [facility] notified 

## 2021-01-20 NOTE — ED Triage Notes (Signed)
Pt here for rocephin injection 

## 2021-04-27 ENCOUNTER — Ambulatory Visit (HOSPITAL_COMMUNITY)
Admission: EM | Admit: 2021-04-27 | Discharge: 2021-04-27 | Disposition: A | Discharge status: Home / Self Care | Payer: Self-pay | Attending: Urgent Care | Admitting: Urgent Care

## 2021-04-27 ENCOUNTER — Encounter (HOSPITAL_COMMUNITY): Payer: Self-pay | Admitting: Emergency Medicine

## 2021-04-27 ENCOUNTER — Other Ambulatory Visit: Payer: Self-pay

## 2021-04-27 ENCOUNTER — Ambulatory Visit (HOSPITAL_COMMUNITY): Payer: PRIVATE HEALTH INSURANCE

## 2021-04-27 DIAGNOSIS — M7918 Myalgia, other site: Secondary | ICD-10-CM

## 2021-04-27 MED ORDER — METAXALONE 800 MG PO TABS: 800.0000 mg | ORAL_TABLET | Freq: Three times a day (TID) | ORAL | 0 refills | Status: AC

## 2021-04-27 NOTE — Discharge Instructions (Addendum)
Heat to the back of your right shoulder, this will help with muscle tension Take the muscle relaxer 3 times daily, out of work tomorrow as this can be a sedating medication. You must establish care with a primary care physician, your blood pressure is likely indicative of hypertension.  Please monitor your blood pressure at home, normal is 120/80. After several days of the muscle relaxer, if symptoms still persist you can alternate Tylenol with ibuprofen. Return to clinic or follow-up with the [facility] walk-in clinic should symptoms continue or worsen
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
## 2021-10-26 ENCOUNTER — Emergency Department (HOSPITAL_COMMUNITY)
Admission: EM | Admit: 2021-10-26 | Discharge: 2021-10-26 | Disposition: A | Discharge status: Home / Self Care | Payer: Self-pay | Attending: Emergency Medicine | Admitting: Emergency Medicine

## 2021-10-26 ENCOUNTER — Emergency Department (HOSPITAL_COMMUNITY): Payer: Self-pay

## 2021-10-26 ENCOUNTER — Other Ambulatory Visit: Payer: Self-pay

## 2021-10-26 ENCOUNTER — Encounter (HOSPITAL_COMMUNITY): Payer: Self-pay

## 2021-10-26 DIAGNOSIS — S0003XA Contusion of scalp, initial encounter: Secondary | ICD-10-CM

## 2021-10-26 DIAGNOSIS — S060XAA Concussion with loss of consciousness status unknown, initial encounter: Secondary | ICD-10-CM

## 2021-10-26 DIAGNOSIS — S0083XA Contusion of other part of head, initial encounter: Secondary | ICD-10-CM

## 2021-10-26 MED ORDER — SODIUM CHLORIDE 0.9 % IV BOLUS: 1000.0000 mL | Freq: Once | INTRAVENOUS | Status: DC

## 2021-10-26 MED ORDER — ACETAMINOPHEN 500 MG PO TABS
1000.0000 mg | ORAL_TABLET | Freq: Once | ORAL | Status: AC
  Administered 2021-10-26: 1000 mg via ORAL
  Filled 2021-10-26: qty 2
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
## 2021-10-26 NOTE — ED Notes (Signed)
Pt is refusing any IV/Bloodwork, I informed pt that we are doing it to help assist in what is going on and for treatments. I offered to straight stick for labs, pt is still insisting to not perform any IV/Labs.
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
## 2024-05-03 ENCOUNTER — Emergency Department (HOSPITAL_BASED_OUTPATIENT_CLINIC_OR_DEPARTMENT_OTHER)
Admission: EM | Admit: 2024-05-03 | Discharge: 2024-05-03 | Disposition: A | Discharge status: Home / Self Care | Attending: Emergency Medicine | Admitting: Emergency Medicine

## 2024-05-03 DIAGNOSIS — M546 Pain in thoracic spine: Secondary | ICD-10-CM | POA: Insufficient documentation

## 2024-05-03 MED ORDER — METAXALONE 800 MG PO TABS: 800.0000 mg | ORAL_TABLET | Freq: Three times a day (TID) | ORAL | 0 refills | Status: AC

## 2024-05-03 MED ORDER — LIDOCAINE 5 % EX PTCH: 1.0000 | MEDICATED_PATCH | CUTANEOUS | 0 refills | Status: AC

## 2024-05-03 NOTE — ED Provider Notes (Signed)
 " [place] [facility] AT [facility] Provider Note   CSN: [id number] Arrival date & time: 05/03/24  1525     Patient presents with: Back Pain   Trevor Shannon is a 49 y.o. male.   49 year old male presents with mid back pain mostly on his right side for the past 2 days.  The pain has been atraumatic and characterizes sharp and worsens with positions.  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  Has a history of could have injured himself.  No bowel or bladder dysfunction.  Pain better with remaining still.  Has used ibuprofen  with limited relief.  Denies any rashes       Prior to Admission medications  Medication Sig Start Date End Date Taking? Authorizing Provider  ibuprofen  (ADVIL ,MOTRIN ) 800 MG tablet Take 800 mg by mouth every 8 (eight) hours as needed for pain.    [provider]    Allergies: Patient has no known allergies.    Review of Systems  All other systems reviewed and are negative.   Updated Vital Signs BP (!) 141/82   Pulse 65   Temp 98.5 F (36.9 C) (Oral)   Resp 16   SpO2 100%   Physical Exam Vitals and nursing note reviewed.  Constitutional:      General: He is not in acute distress.    Appearance: Normal appearance. He is well-developed. He is not toxic-appearing.  HENT:     Head: Normocephalic and atraumatic.  Eyes:     General: Lids are normal.     Conjunctiva/sclera: Conjunctivae normal.     Pupils: Pupils are equal, round, and reactive to light.  Neck:     Thyroid: No thyroid mass.     Trachea: No tracheal deviation.  Cardiovascular:     Rate and Rhythm: Normal rate and regular rhythm.     Heart sounds: Normal heart sounds. No murmur heard.    No gallop.  Pulmonary:     Effort: Pulmonary effort is normal. No respiratory distress.     Breath sounds: Normal breath sounds. No stridor. No decreased breath sounds, wheezing, rhonchi or rales.  Abdominal:     General: There is no distension.     Palpations: Abdomen  is soft.     Tenderness: There is no abdominal tenderness. There is no rebound.  Musculoskeletal:        General: No tenderness. Normal range of motion.     Cervical back: Normal range of motion and neck supple.       Back:  Skin:    General: Skin is warm and dry.     Findings: No abrasion or rash.  Neurological:     Mental Status: He is alert and oriented to person, place, and time. Mental status is at baseline.     GCS: GCS eye subscore is 4. GCS verbal subscore is 5. GCS motor subscore is 6.     Cranial Nerves: No cranial nerve deficit.     Sensory: No sensory deficit.     Motor: Motor function is intact.  Psychiatric:        Attention and Perception: Attention normal.        Speech: Speech normal.        Behavior: Behavior normal.     (all labs ordered are listed, but only abnormal results are displayed) Labs Reviewed - No data to display  EKG: None  Radiology: No results found.   Procedures   Medications Ordered in the ED -  No data to display                                  Medical Decision Making  Patient with reproducible musculoskeletal pain on the right likely trapezius muscle.  Low suspicion for spinal pathology.  He has not been short of breath.  No cough.  Do not think he needs to have an x-ray.  Will prescribe Lidoderm  patches as well as muscle relaxants and return precautions given     Final diagnoses:  None    ED Discharge Orders     None          Dasie Faden, MD 05/03/24 1553  "

## 2024-05-03 NOTE — ED Triage Notes (Addendum)
 Patient states mid back pain for 2 days. Worse with movement. Denies injury. Denies bowel/bladder incontinence. Ambulatory to triage with steady gait

## 2024-05-03 NOTE — ED Notes (Signed)
